# Patient Record
Sex: Male | Born: 1995 | Hispanic: Yes | Marital: Married
Health system: Southern US, Community
[De-identification: ages and names within clinical notes are randomized; demographics above are authoritative.]

---

## 2017-12-03 ENCOUNTER — Other Ambulatory Visit: Payer: Self-pay

## 2017-12-03 ENCOUNTER — Emergency Department
Admission: EM | Admit: 2017-12-03 | Discharge: 2017-12-03 | Disposition: A | Discharge status: Home / Self Care | Payer: Self-pay | Attending: Emergency Medicine | Admitting: Emergency Medicine

## 2017-12-03 ENCOUNTER — Emergency Department: Payer: Self-pay

## 2017-12-03 DIAGNOSIS — R0789 Other chest pain: Secondary | ICD-10-CM | POA: Insufficient documentation

## 2017-12-03 LAB — BASIC METABOLIC PANEL
Anion gap: 9 (ref 5–15)
BUN: 17 mg/dL (ref 6–20)
CALCIUM: 9.6 mg/dL (ref 8.9–10.3)
CHLORIDE: 109 mmol/L (ref 98–111)
CO2: 21 mmol/L — AB (ref 22–32)
Creatinine, Ser: 0.85 mg/dL (ref 0.61–1.24)
GFR calc Af Amer: >60 mL/min (ref >60)
GFR calc non Af Amer: >60 mL/min (ref >60)
GLUCOSE: 115 mg/dL — AB (ref 70–99)
Potassium: 3.6 mmol/L (ref 3.5–5.1)
Sodium: 139 mmol/L (ref 135–145)

## 2017-12-03 LAB — CBC
HCT: 44.7 % (ref 40.0–52.0)
HEMOGLOBIN: 15.6 g/dL (ref 13.0–18.0)
MCH: 30 pg (ref 26.0–34.0)
MCHC: 34.8 g/dL (ref 32.0–36.0)
MCV: 86.2 fL (ref 80.0–100.0)
PLATELETS: 237 10*3/uL (ref 150–440)
RBC: 5.19 MIL/uL (ref 4.40–5.90)
RDW: 13.1 % (ref 11.5–14.5)
WBC: 9.3 10*3/uL (ref 3.8–10.6)

## 2017-12-03 LAB — TROPONIN I

## 2017-12-03 MED ORDER — LORAZEPAM 1 MG PO TABS
1.0000 mg | ORAL_TABLET | Freq: Once | ORAL | Status: AC
  Administered 2017-12-03: 1 mg via ORAL
  Filled 2017-12-03: qty 1

## 2017-12-03 MED ORDER — KETOROLAC TROMETHAMINE 30 MG/ML IJ SOLN: 15.0000 mg | Freq: Once | INTRAMUSCULAR | Status: DC

## 2017-12-03 MED ORDER — GI COCKTAIL ~~LOC~~
30.0000 mL | Freq: Once | ORAL | Status: AC
  Administered 2017-12-03: 30 mL via ORAL
  Filled 2017-12-03: qty 30

## 2017-12-03 MED ORDER — KETOROLAC TROMETHAMINE 30 MG/ML IJ SOLN
15.0000 mg | Freq: Once | INTRAMUSCULAR | Status: AC
  Administered 2017-12-03: 15 mg via INTRAVENOUS
  Filled 2017-12-03: qty 1

## 2017-12-03 NOTE — ED Notes (Signed)
Patient to waiting room via wheelchair by EMS.  Reports chest pain that increases with deep breathing. Per EMS  12-lead normal sinus, 100% pulse oxi room air, 144/96, 20g left antecub saline loc.

## 2017-12-03 NOTE — ED Provider Notes (Signed)
[facility] Emergency Department Provider Note  ____________________________________________   First MD Initiated Contact with Patient 12/03/17 0151     (approximate)  I have reviewed the triage vital signs and the nursing notes.   HISTORY  Chief Complaint Chest Pain   HPI Adam FeinsteinMiguel Boyle is a 22 y.o. male who comes to the emergency department with sharp upper chest pain for the past hour or so.  The pain came on suddenly and has been constant.  It is worsened by sitting up or lying down.  It is worsened by deep inspiration.  It is nonradiating.  No nausea.  No diaphoresis.  The pain seemed to begin shortly after being pulled over by police officer this evening.  He denies abdominal pain nausea or vomiting.  No history of DVT or pulmonary embolism.  No recent surgery travel or immobilization.  The pain is not ripping or tearing does not go straight to his back.  No recent coryza or URI symptoms.   No past medical history on file.  There are no active problems to display for this patient.     Prior to Admission medications   Not on File    Allergies Patient has no known allergies.  No family history on file.  Social History Social History   Tobacco Use  . Smoking status: Not on file  Substance Use Topics  . Alcohol use: Not on file  . Drug use: Not on file    Review of Systems Constitutional: No fever/chills Eyes: No visual changes. ENT: No sore throat. Cardiovascular: Positive for chest pain. Respiratory: Positive for shortness of breath. Gastrointestinal: No abdominal pain.  No nausea, no vomiting.  No diarrhea.  No constipation. Genitourinary: Negative for dysuria. Musculoskeletal: Negative for back pain. Skin: Negative for rash. Neurological: Negative for headaches, focal weakness or numbness.   ____________________________________________   PHYSICAL EXAM:  VITAL SIGNS: ED Triage Vitals  Enc Vitals Group     BP 12/03/17 0127  129/76     Pulse Rate 12/03/17 0127 75     Resp 12/03/17 0127 16     Temp 12/03/17 0127 97.7 F (36.5 C)     Temp Source 12/03/17 0127 Oral     SpO2 12/03/17 0127 99 %     Weight 12/03/17 0133 160 lb (72.6 kg)     Height 12/03/17 0133 5\' 6"  (1.676 m)     Head Circumference --      Peak Flow --      Pain Score 12/03/17 0133 7     Pain Loc --      Pain Edu? --      Excl. in GC? --     Constitutional: Alert and oriented x4 fidgeting and anxious appearing Eyes: PERRL EOMI. dilated and brisk Head: Atraumatic. Nose: No congestion/rhinnorhea. Mouth/Throat: No trismus Neck: No stridor.   Cardiovascular: Normal rate, regular rhythm. Grossly normal heart sounds.  Good peripheral circulation. Respiratory: Normal respiratory effort.  No retractions. Lungs CTAB and moving good air Gastrointestinal: Soft nontender Musculoskeletal: No lower extremity edema   Neurologic:  Normal speech and language. No gross focal neurologic deficits are appreciated. Skin: Mild excoriations across legs Psychiatric: Anxious appearing    ____________________________________________   DIFFERENTIAL includes but not limited to  Pericarditis, myocarditis, pulmonary embolism, anxiety, cocaine intoxication ____________________________________________   LABS (all labs ordered are listed, but only abnormal results are displayed)  Labs Reviewed  BASIC METABOLIC PANEL - Abnormal; Notable for the following components:  Result Value   CO2 21 (*)    Glucose, Bld 115 (*)    All other components within normal limits  CBC  TROPONIN I    Lab work reviewed by me with no acute disease __________________________________________  EKG  ED ECG REPORT I, Merrily Brittle, the attending physician, personally viewed and interpreted this ECG.  Date: 12/03/2017 EKG Time:  Rate: 82 Rhythm: normal sinus rhythm QRS Axis: normal Intervals: normal ST/T Wave abnormalities: normal Narrative Interpretation: no  evidence of acute ischemia  ____________________________________________  RADIOLOGY  Chest x-ray reviewed by me with no acute disease ____________________________________________   PROCEDURES  Procedure(s) performed: no  Procedures  Critical Care performed: no  ____________________________________________   INITIAL IMPRESSION / ASSESSMENT AND PLAN / ED COURSE  Pertinent labs & imaging results that were available during my care of the patient were reviewed by me and considered in my medical decision making (see chart for details).   The patient arrives somewhat anxious appearing.  His chest pain is atypical and his EKG is reassuring.  Chest x-ray without infiltrate.  Unclear etiology of his symptoms but it could be related to anxiety versus Pap from the medic congestion.  Following Ativan he does feel somewhat improved.  I will refer him to primary care.  Strict return precautions have been given.      ____________________________________________   FINAL CLINICAL IMPRESSION(S) / ED DIAGNOSES  Final diagnoses:  Atypical chest pain      NEW MEDICATIONS STARTED DURING THIS VISIT:  New Prescriptions   No medications on file     Note:  This document was prepared using Dragon voice recognition software and may include unintentional dictation errors.     Merrily Brittle, MD 12/03/17 [id number]

## 2017-12-03 NOTE — Discharge Instructions (Signed)
It was a pleasure to take care of you today, and thank you for coming to our emergency department.  If you have any questions or concerns before leaving please ask the nurse to grab me and I'm more than happy to go through your aftercare instructions again.  If you were prescribed any opioid pain medication today such as Norco, Vicodin, Percocet, morphine, hydrocodone, or oxycodone please make sure you do not drive when you are taking this medication as it can alter your ability to drive safely.  If you have any concerns once you are home that you are not improving or are in fact getting worse before you can make it to your follow-up appointment, please do not hesitate to call 911 and come back for further evaluation.  Merrily BrittleNeil Nasha Diss, MD  Results for orders placed or performed during the hospital encounter of 12/03/17  Basic metabolic panel  Result Value Ref Range   Sodium 139 135 - 145 mmol/L   Potassium 3.6 3.5 - 5.1 mmol/L   Chloride 109 98 - 111 mmol/L   CO2 21 (L) 22 - 32 mmol/L   Glucose, Bld 115 (H) 70 - 99 mg/dL   BUN 17 6 - 20 mg/dL   Creatinine, Ser 6.960.85 0.61 - 1.24 mg/dL   Calcium 9.6 8.9 - 29.510.3 mg/dL   GFR calc non Af Amer >60 >60 mL/min   GFR calc Af Amer >60 >60 mL/min   Anion gap 9 5 - 15  CBC  Result Value Ref Range   WBC 9.3 3.8 - 10.6 K/uL   RBC 5.19 4.40 - 5.90 MIL/uL   Hemoglobin 15.6 13.0 - 18.0 g/dL   HCT 28.444.7 13.240.0 - 44.052.0 %   MCV 86.2 80.0 - 100.0 fL   MCH 30.0 26.0 - 34.0 pg   MCHC 34.8 32.0 - 36.0 g/dL   RDW 10.213.1 72.511.5 - 36.614.5 %   Platelets 237 150 - 440 K/uL  Troponin I  Result Value Ref Range   Troponin I <0.03 <0.03 ng/mL   Dg Chest 2 View  Result Date: 12/03/2017 CLINICAL DATA:  Chest pain EXAM: CHEST - 2 VIEW COMPARISON:  None. FINDINGS: The heart size and mediastinal contours are within normal limits. Both lungs are clear. The visualized skeletal structures are unremarkable. IMPRESSION: No active cardiopulmonary disease. Electronically Signed   By:  Jasmine PangKim  Fujinaga M.D.   On: 12/03/2017 01:51

## 2017-12-03 NOTE — ED Triage Notes (Signed)
Pt complains of central chest pain for one hour. Pt states pain onset while at rest. Pt states he does feel shob. Pt appears in no acute distress, ems with 20g int to left ac.

## 2019-07-08 IMAGING — CR DG CHEST 2V
2 series · 2 of 2 positions shown · non-contrast
Comparison: None.

CLINICAL DATA: Chest pain

EXAM:
CHEST - 2 VIEW

[chest pa]
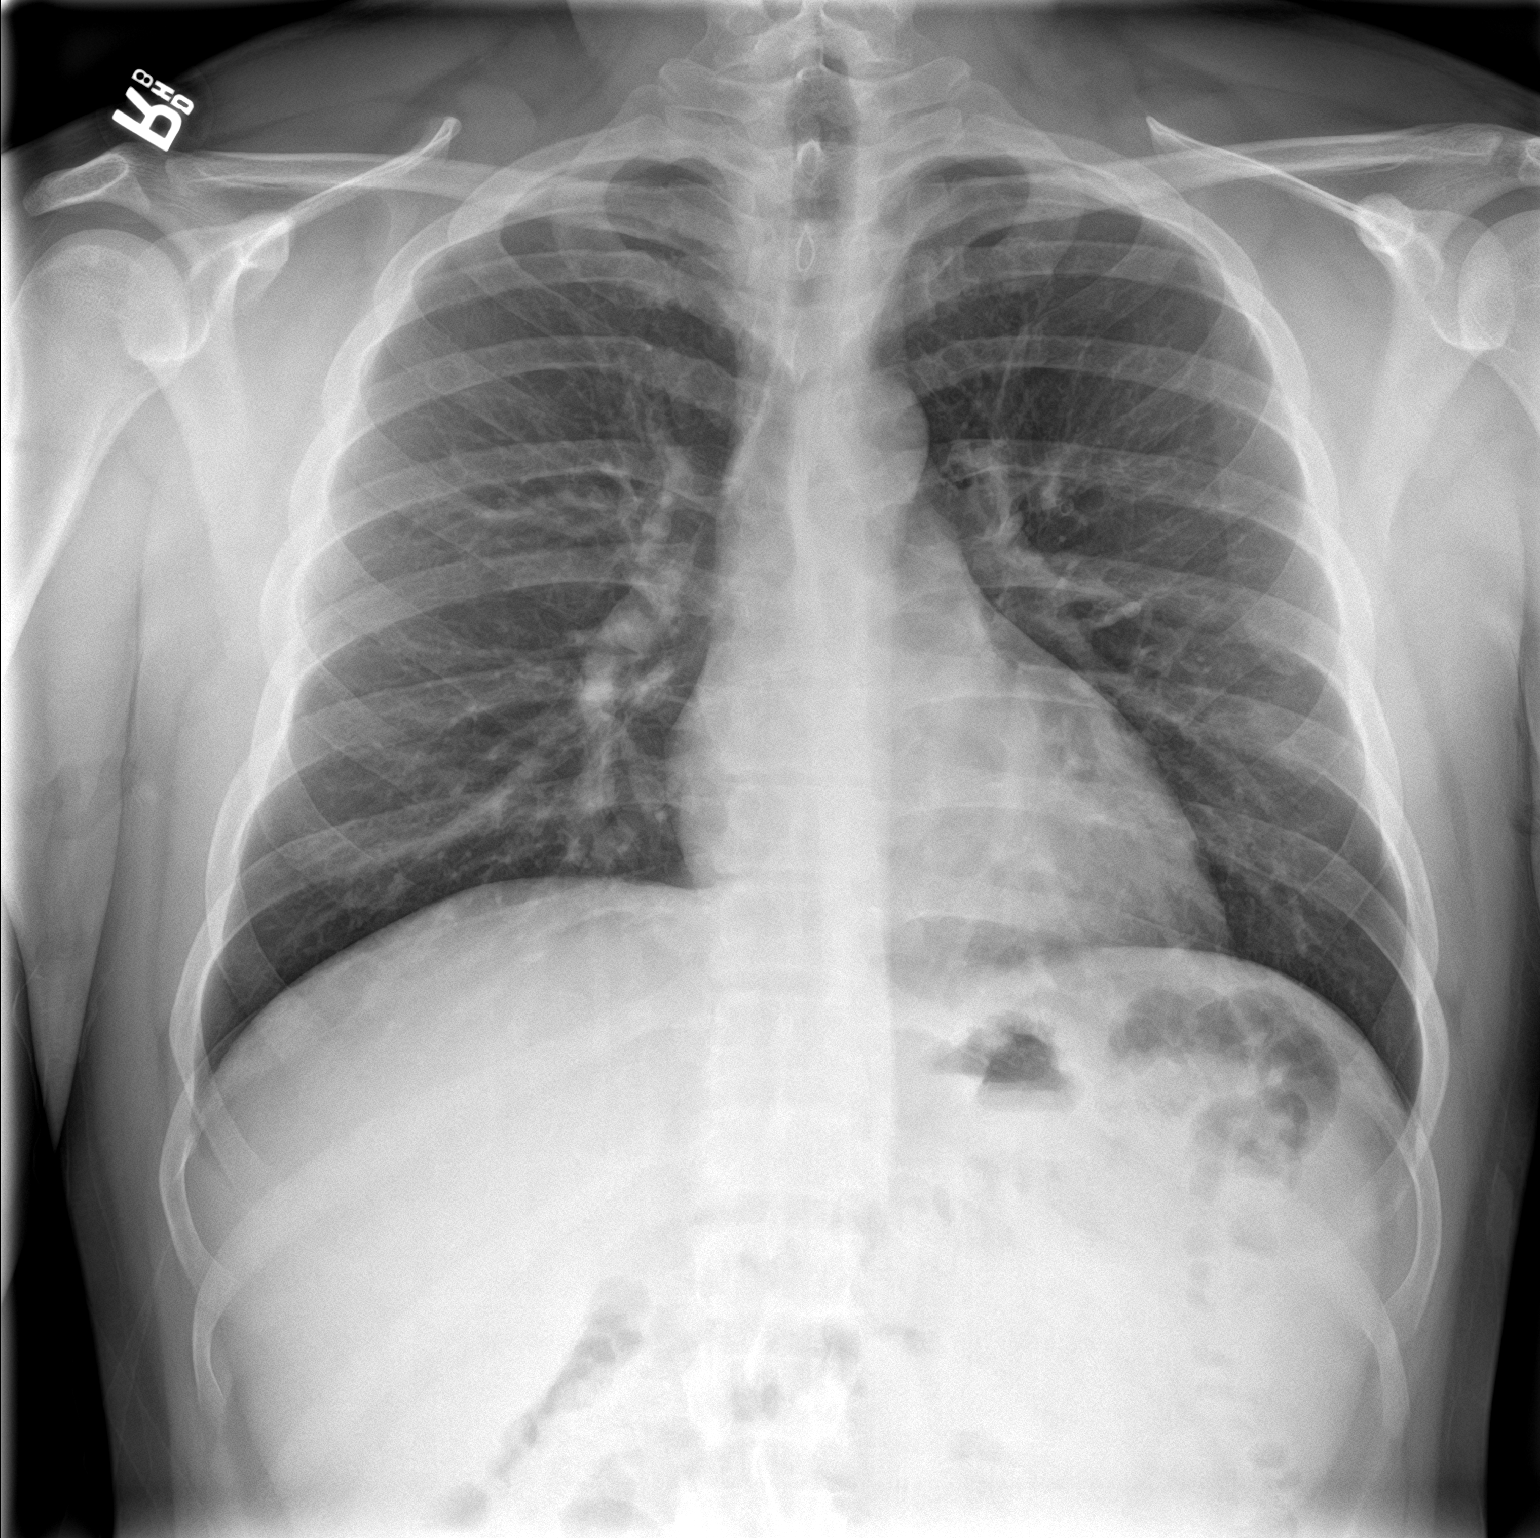

[chest lat]
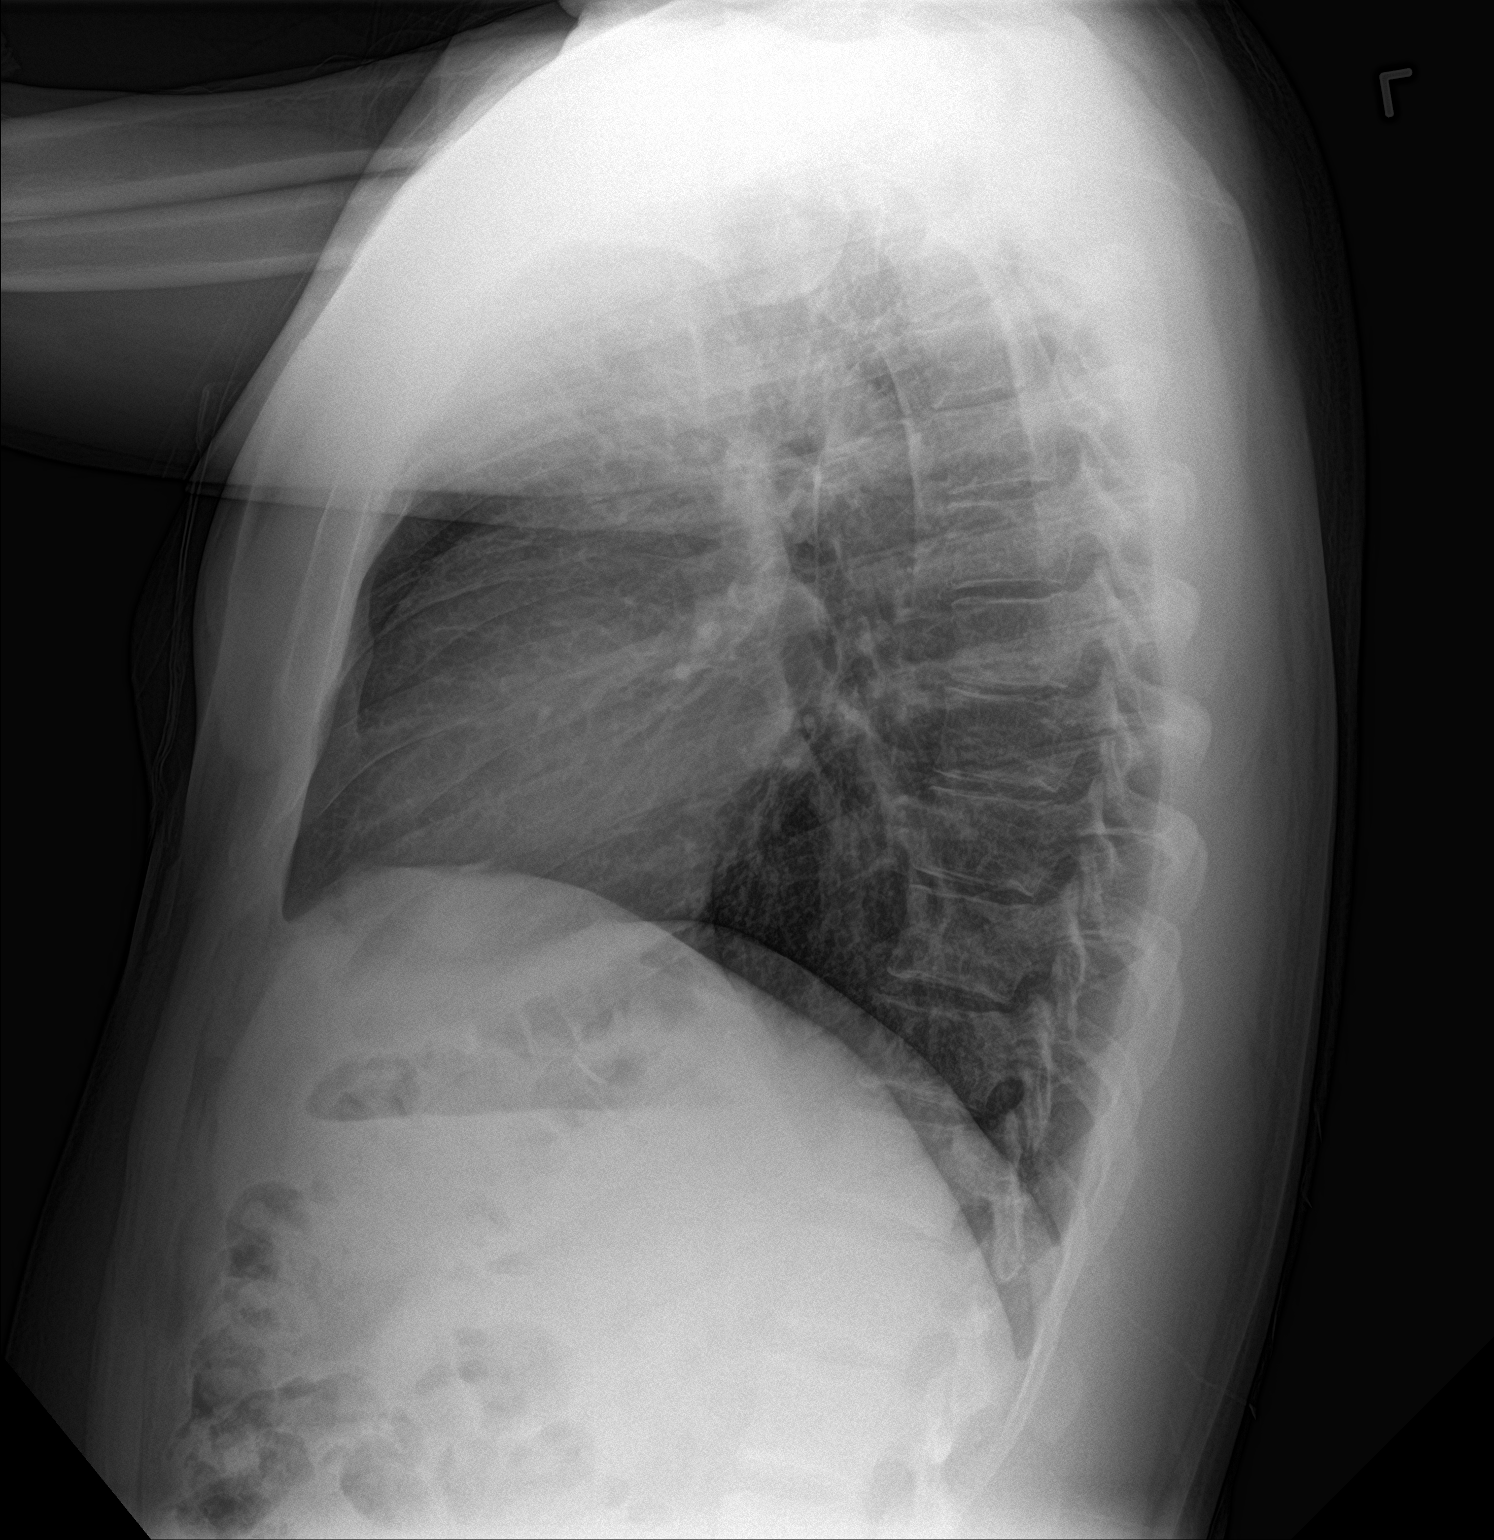

[2 of 2 positions shown; findings below may reference images not displayed]

FINDINGS: The heart size and mediastinal contours are within normal limits.
Both lungs are clear. The visualized skeletal structures are
unremarkable.
IMPRESSION: No active cardiopulmonary disease.
